# Patient Record
Sex: Male | Born: 1990 | Race: White | Hispanic: No | Marital: Single | Smoking: Current every day smoker
Health system: Southern US, Community
[De-identification: ages and names within clinical notes are randomized; demographics above are authoritative.]

---
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
## 2012-05-18 HISTORY — PX: WISDOM TOOTH EXTRACTION: SHX21

## 2013-07-18 ENCOUNTER — Encounter (HOSPITAL_COMMUNITY): Payer: Self-pay | Admitting: Emergency Medicine

## 2013-07-18 ENCOUNTER — Emergency Department (HOSPITAL_COMMUNITY)
Admission: EM | Admit: 2013-07-18 | Discharge: 2013-07-19 | Disposition: A | Discharge status: Home / Self Care | Payer: BC Managed Care – PPO | Attending: Emergency Medicine | Admitting: Emergency Medicine

## 2013-07-18 ENCOUNTER — Emergency Department (HOSPITAL_COMMUNITY): Payer: BC Managed Care – PPO

## 2013-07-18 DIAGNOSIS — Y929 Unspecified place or not applicable: Secondary | ICD-10-CM | POA: Insufficient documentation

## 2013-07-18 DIAGNOSIS — S41009A Unspecified open wound of unspecified shoulder, initial encounter: Secondary | ICD-10-CM | POA: Insufficient documentation

## 2013-07-18 DIAGNOSIS — F17200 Nicotine dependence, unspecified, uncomplicated: Secondary | ICD-10-CM | POA: Insufficient documentation

## 2013-07-18 DIAGNOSIS — G478 Other sleep disorders: Secondary | ICD-10-CM | POA: Insufficient documentation

## 2013-07-18 DIAGNOSIS — Z23 Encounter for immunization: Secondary | ICD-10-CM | POA: Insufficient documentation

## 2013-07-18 DIAGNOSIS — W1809XA Striking against other object with subsequent fall, initial encounter: Secondary | ICD-10-CM | POA: Insufficient documentation

## 2013-07-18 DIAGNOSIS — S0990XA Unspecified injury of head, initial encounter: Secondary | ICD-10-CM | POA: Insufficient documentation

## 2013-07-18 DIAGNOSIS — S41012A Laceration without foreign body of left shoulder, initial encounter: Secondary | ICD-10-CM

## 2013-07-18 DIAGNOSIS — F430 Acute stress reaction: Secondary | ICD-10-CM | POA: Insufficient documentation

## 2013-07-18 DIAGNOSIS — Z79899 Other long term (current) drug therapy: Secondary | ICD-10-CM | POA: Insufficient documentation

## 2013-07-18 DIAGNOSIS — R569 Unspecified convulsions: Secondary | ICD-10-CM | POA: Insufficient documentation

## 2013-07-18 DIAGNOSIS — Y9389 Activity, other specified: Secondary | ICD-10-CM | POA: Insufficient documentation

## 2013-07-18 LAB — CBG MONITORING, ED: Glucose-Capillary: 113 mg/dL — ABNORMAL HIGH (ref 70–99)

## 2013-07-18 MED ORDER — TETANUS-DIPHTH-ACELL PERTUSSIS 5-2.5-18.5 LF-MCG/0.5 IM SUSP
0.5000 mL | Freq: Once | INTRAMUSCULAR | Status: AC
  Administered 2013-07-19: 0.5 mL via INTRAMUSCULAR
  Filled 2013-07-18: qty 0.5

## 2013-07-18 NOTE — ED Provider Notes (Signed)
CSN: [id number]     Arrival date & time 07/18/13  2255 History   First MD Initiated Contact with Patient 07/18/13 2318     Chief Complaint  Patient presents with  . Fall  . Loss of Consciousness     (Consider location/radiation/quality/duration/timing/severity/associated sxs/prior Treatment) Patient is a 23 y.o. male presenting with seizures. The history is provided by the patient. No language interpreter was used.  Seizures Seizure activity on arrival: no   Seizure type:  Unable to specify Preceding symptoms comment:  None Initial focality:  None Episode characteristics: generalized shaking and unresponsiveness   Episode characteristics: no incontinence   Postictal symptoms: memory loss   Postictal symptoms: no somnolence   Postictal symptoms comment:  Headache Return to baseline: yes   Severity:  Moderate Duration:  1 minute Timing:  Once Number of seizures this episode:  1 Progression:  Resolved Context: decreased sleep, drug use, emotional upset and stress   Context: not alcohol withdrawal, not change in medication, not family hx of seizures, not fever, not possible medication ingestion and not previous head injury   Recent head injury:  No recent head injuries PTA treatment:  None History of seizures: no     History reviewed. No pertinent past medical history. Past Surgical History  Procedure Laterality Date  . Wisdom tooth extraction Bilateral 2014    x5   History reviewed. No pertinent family history. History  Substance Use Topics  . Smoking status: Current Every Day Smoker -- 0.25 packs/day    Types: Cigarettes  . Smokeless tobacco: Never Used  . Alcohol Use: Yes    Review of Systems  Constitutional: Negative for fever, activity change, appetite change and fatigue.  HENT: Negative for congestion, facial swelling, rhinorrhea and trouble swallowing.   Eyes: Negative for photophobia and pain.  Respiratory: Negative for cough, chest tightness and shortness of  breath.   Cardiovascular: Negative for chest pain and leg swelling.  Gastrointestinal: Negative for nausea, vomiting, abdominal pain, diarrhea and constipation.  Endocrine: Negative for polydipsia and polyuria.  Genitourinary: Negative for dysuria, urgency, decreased urine volume and difficulty urinating.  Musculoskeletal: Negative for back pain and gait problem.  Skin: Negative for color change, rash and wound.  Allergic/Immunologic: Negative for immunocompromised state.  Neurological: Positive for seizures. Negative for dizziness, facial asymmetry, speech difficulty, weakness, numbness and headaches.  Psychiatric/Behavioral: Positive for sleep disturbance. Negative for suicidal ideas, confusion, self-injury, decreased concentration and agitation.      Allergies  Review of patient's allergies indicates no known allergies.  Home Medications   Current Outpatient Rx  Name  Route  Sig  Dispense  Refill  . Cyanocobalamin (VITAMIN B-12 CR PO)   Oral   Take 1 tablet by mouth daily.         Marland Kitchen omega-3 acid ethyl esters (LOVAZA) 1 G capsule   Oral   Take 1 g by mouth daily.          BP 133/74  Pulse 78  Temp(Src) 98.8 F (37.1 C) (Oral)  Resp 18  SpO2 98% Physical Exam  Constitutional: He is oriented to person, place, and time. He appears well-developed and well-nourished. No distress.  HENT:  Head: Normocephalic and atraumatic.  Mouth/Throat: No oropharyngeal exudate.  Eyes: Pupils are equal, round, and reactive to light.  Neck: Normal range of motion. Neck supple.  Cardiovascular: Normal rate, regular rhythm and normal heart sounds.  Exam reveals no gallop and no friction rub.   No murmur heard. Pulmonary/Chest: Effort normal  and breath sounds normal. No respiratory distress. He has no wheezes. He has no rales.  Abdominal: Soft. Bowel sounds are normal. He exhibits no distension and no mass. There is no tenderness. There is no rebound and no guarding.  Musculoskeletal:  Normal range of motion. He exhibits no edema and no tenderness.       Arms: Neurological: He is alert and oriented to person, place, and time. He has normal strength. He displays no tremor. No cranial nerve deficit or sensory deficit. He exhibits normal muscle tone. He displays a negative Romberg sign. Coordination and gait normal. GCS eye subscore is 4. GCS verbal subscore is 5. GCS motor subscore is 6.  Skin: Skin is warm and dry.  Psychiatric: He has a normal mood and affect.    ED Course  Procedures (including critical care time) Labs Review Labs Reviewed  URINE RAPID DRUG SCREEN (HOSP PERFORMED) - Abnormal; Notable for the following:    Benzodiazepines POSITIVE (*)    Tetrahydrocannabinol POSITIVE (*)    All other components within normal limits  CBC - Abnormal; Notable for the following:    WBC 16.8 (*)    All other components within normal limits  URINALYSIS, ROUTINE W REFLEX MICROSCOPIC - Abnormal; Notable for the following:    Hgb urine dipstick TRACE (*)    Protein, ur 30 (*)    All other components within normal limits  CBG MONITORING, ED - Abnormal; Notable for the following:    Glucose-Capillary 113 (*)    All other components within normal limits  COMPREHENSIVE METABOLIC PANEL  URINE MICROSCOPIC-ADD ON   Imaging Review Ct Head Wo Contrast  07/19/2013   CLINICAL DATA:  New sided onset of seizures. Loss of consciousness. Not sure if hit head.  EXAM: CT HEAD WITHOUT CONTRAST  TECHNIQUE: Contiguous axial images were obtained from the base of the skull through the vertex without intravenous contrast.  COMPARISON:  None.  FINDINGS: No skull fracture or intracranial hemorrhage.  No intracranial mass lesion noted on this unenhanced exam.  No CT evidence of large acute infarct.  No hydrocephalus  For further delineation of seizure focus, contrast enhanced MR on elective basis is recommended.  IMPRESSION: No skull fracture or intracranial hemorrhage.  No seizure focus identified on  the present unenhanced head CT. Follow-up as noted above.   Electronically Signed   By: Bridgett Larsson M.D.   On: 07/19/2013 00:40     EKG Interpretation   Date/Time:  Tuesday July 18 2013 23:21:57 EST Ventricular Rate:  77 PR Interval:  122 QRS Duration: 87 QT Interval:  350 QTC Calculation: 396 R Axis:   76 Text Interpretation:  Sinus rhythm Anterolateral Q wave, probably normal  for age No old tracing to compare Confirmed by Mendel Binsfeld  MD, Caridad Silveira [phone])  on 07/18/2013 11:53:10 PM      MDM   Final diagnoses:  Seizure  Laceration of shoulder, left    Pt is a 23 y.o. male with Pmhx as above who presents with new onset possible seizure at home just PTA. Pt states he has been stressed recently over the ill health of his mother, has not slept in 2 days. Per roommates, he got up from the couch to answer the door, grabbed his face, fell backwards and shattered a pyrex dish on a table top, and about 1 min of generalized shaking, then woke up with h/a, but no post-itcal phase.  Pt denies current SI/HI, but has been depressed due to ill health  of his mother.  Denies fever, chills, n/v, d/a, focal neuro complaints other than h/a. On PE, VSS, pt in NAD. No focal neuro exam findings. He has several lacerations to L shoulder. EKG w/o ischemic changes, CT head unremarkable, CBC, BMP grossly nml.  UDS positive for benzos and THC.  It is not clear if this is new onset seizure vs syncope w/ myoclonic jerking.  As pt at baseline, will d/c home with plan for outpt f/u with Neurology. Resources also given for outpt pysch referral.         Shanna CiscoMegan E Damauri Minion, MD 07/19/13 [phone]

## 2013-07-18 NOTE — ED Notes (Addendum)
Pt reports that he got off the couch and fell, losing consciousness and was told he was seizing on the floor, no prior hx of any related events, does not remember these events. Pt hit a glass table and has multiple 2in lacs to his L shoulder and side. Pt a&o x4, reports he is under a lot of stress, no other changes known.

## 2013-07-19 LAB — RAPID URINE DRUG SCREEN, HOSP PERFORMED
Amphetamines: NOT DETECTED
Barbiturates: NOT DETECTED
Benzodiazepines: POSITIVE — AB
COCAINE: NOT DETECTED
OPIATES: NOT DETECTED
TETRAHYDROCANNABINOL: POSITIVE — AB

## 2013-07-19 LAB — COMPREHENSIVE METABOLIC PANEL
ALT: 13 U/L (ref 0–53)
AST: 22 U/L (ref 0–37)
Albumin: 4.6 g/dL (ref 3.5–5.2)
Alkaline Phosphatase: 71 U/L (ref 39–117)
BUN: 7 mg/dL (ref 6–23)
CO2: 23 mEq/L (ref 19–32)
CREATININE: 0.85 mg/dL (ref 0.50–1.35)
Calcium: 9.6 mg/dL (ref 8.4–10.5)
Chloride: 102 mEq/L (ref 96–112)
GFR calc Af Amer: >90 mL/min (ref >90)
GFR calc non Af Amer: >90 mL/min (ref >90)
Glucose, Bld: 92 mg/dL (ref 70–99)
Potassium: 3.8 mEq/L (ref 3.7–5.3)
SODIUM: 140 meq/L (ref 137–147)
TOTAL PROTEIN: 7.4 g/dL (ref 6.0–8.3)
Total Bilirubin: 1 mg/dL (ref 0.3–1.2)

## 2013-07-19 LAB — CBC
HCT: 43.6 % (ref 39.0–52.0)
HEMOGLOBIN: 15.7 g/dL (ref 13.0–17.0)
MCH: 32.2 pg (ref 26.0–34.0)
MCHC: 36 g/dL (ref 30.0–36.0)
MCV: 89.5 fL (ref 78.0–100.0)
Platelets: 254 10*3/uL (ref 150–400)
RBC: 4.87 MIL/uL (ref 4.22–5.81)
RDW: 12.9 % (ref 11.5–15.5)
WBC: 16.8 10*3/uL — ABNORMAL HIGH (ref 4.0–10.5)

## 2013-07-19 LAB — URINALYSIS, ROUTINE W REFLEX MICROSCOPIC
Bilirubin Urine: NEGATIVE
Glucose, UA: NEGATIVE mg/dL
Ketones, ur: NEGATIVE mg/dL
LEUKOCYTES UA: NEGATIVE
NITRITE: NEGATIVE
PH: 5.5 (ref 5.0–8.0)
Protein, ur: 30 mg/dL — AB
Specific Gravity, Urine: 1.017 (ref 1.005–1.030)
UROBILINOGEN UA: 0.2 mg/dL (ref 0.0–1.0)

## 2013-07-19 LAB — URINE MICROSCOPIC-ADD ON

## 2013-07-19 MED ORDER — IBUPROFEN 200 MG PO TABS
600.0000 mg | ORAL_TABLET | Freq: Once | ORAL | Status: AC
  Administered 2013-07-19: 600 mg via ORAL
  Filled 2013-07-19: qty 3

## 2013-07-19 NOTE — ED Notes (Signed)
MD at bedside at this time.

## 2013-07-19 NOTE — Discharge Instructions (Signed)
Laceration Care, Adult °A laceration is a cut or lesion that goes through all layers of the skin and into the tissue just beneath the skin. °TREATMENT  °Some lacerations may not require closure. Some lacerations may not be able to be closed due to an increased risk of infection. It is important to see your caregiver as soon as possible after an injury to minimize the risk of infection and maximize the opportunity for successful closure. °If closure is appropriate, pain medicines may be given, if needed. The wound will be cleaned to help prevent infection. Your caregiver will use stitches (sutures), staples, wound glue (adhesive), or skin adhesive strips to repair the laceration. These tools bring the skin edges together to allow for faster healing and a better cosmetic outcome. However, all wounds will heal with a scar. Once the wound has healed, scarring can be minimized by covering the wound with sunscreen during the day for 1 full year. °HOME CARE INSTRUCTIONS  °For sutures or staples: °· Keep the wound clean and dry. °· If you were given a bandage (dressing), you should change it at least once a day. Also, change the dressing if it becomes wet or dirty, or as directed by your caregiver. °· Wash the wound with soap and water 2 times a day. Rinse the wound off with water to remove all soap. Pat the wound dry with a clean towel. °· After cleaning, apply a thin layer of the antibiotic ointment as recommended by your caregiver. This will help prevent infection and keep the dressing from sticking. °· You may shower as usual after the first 24 hours. Do not soak the wound in water until the sutures are removed. °· Only take over-the-counter or prescription medicines for pain, discomfort, or fever as directed by your caregiver. °· Get your sutures or staples removed as directed by your caregiver. °For skin adhesive strips: °· Keep the wound clean and dry. °· Do not get the skin adhesive strips wet. You may bathe  carefully, using caution to keep the wound dry. °· If the wound gets wet, pat it dry with a clean towel. °· Skin adhesive strips will fall off on their own. You may trim the strips as the wound heals. Do not remove skin adhesive strips that are still stuck to the wound. They will fall off in time. °For wound adhesive: °· You may briefly wet your wound in the shower or bath. Do not soak or scrub the wound. Do not swim. Avoid periods of heavy perspiration until the skin adhesive has fallen off on its own. After showering or bathing, gently pat the wound dry with a clean towel. °· Do not apply liquid medicine, cream medicine, or ointment medicine to your wound while the skin adhesive is in place. This may loosen the film before your wound is healed. °· If a dressing is placed over the wound, be careful not to apply tape directly over the skin adhesive. This may cause the adhesive to be pulled off before the wound is healed. °· Avoid prolonged exposure to sunlight or tanning lamps while the skin adhesive is in place. Exposure to ultraviolet light in the first year will darken the scar. °· The skin adhesive will usually remain in place for 5 to 10 days, then naturally fall off the skin. Do not pick at the adhesive film. °You may need a tetanus shot if: °· You cannot remember when you had your last tetanus shot. °· You have never had a tetanus   shot. If you get a tetanus shot, your arm may swell, get red, and feel warm to the touch. This is common and not a problem. If you need a tetanus shot and you choose not to have one, there is a rare chance of getting tetanus. Sickness from tetanus can be serious. SEEK MEDICAL CARE IF:   You have redness, swelling, or increasing pain in the wound.  You see a red line that goes away from the wound.  You have yellowish-white fluid (pus) coming from the wound.  You have a fever.  You notice a bad smell coming from the wound or dressing.  Your wound breaks open before or  after sutures have been removed.  You notice something coming out of the wound such as wood or glass.  Your wound is on your hand or foot and you cannot move a finger or toe. SEEK IMMEDIATE MEDICAL CARE IF:   Your pain is not controlled with prescribed medicine.  You have severe swelling around the wound causing pain and numbness or a change in color in your arm, hand, leg, or foot.  Your wound splits open and starts bleeding.  You have worsening numbness, weakness, or loss of function of any joint around or beyond the wound.  You develop painful lumps near the wound or on the skin anywhere on your body. MAKE SURE YOU:   Understand these instructions.  Will watch your condition.  Will get help right away if you are not doing well or get worse. Document Released: 05/04/2005 Document Revised: 07/27/2011 Document Reviewed: 10/28/2010 [facility] Patient Information 2014 [place], [place]. Seizure, Adult A seizure is abnormal electrical activity in the brain. Seizures usually last from 30 seconds to 2 minutes. There are various types of seizures. Before a seizure, you may have a warning sensation (aura) that a seizure is about to occur. An aura may include the following symptoms:   Fear or anxiety.  Nausea.  Feeling like the room is spinning (vertigo).  Vision changes, such as seeing flashing lights or spots. Common symptoms during a seizure include:  A change in attention or behavior (altered mental status).  Convulsions with rhythmic jerking movements.  Drooling.  Rapid eye movements.  Grunting.  Loss of bladder and bowel control.  Bitter taste in the mouth.  Tongue biting. After a seizure, you may feel confused and sleepy. You may also have an injury resulting from convulsions during the seizure. HOME CARE INSTRUCTIONS   If you are given medicines, take them exactly as prescribed by your health care provider.  Keep all follow-up appointments as directed by your  health care provider.  Do not swim or drive or engage in risky activity during which a seizure could cause further injury to you or others until your health care provider says it is OK.  Get adequate rest.  Teach friends and family what to do if you have a seizure. They should:  Lay you on the ground to prevent a fall.  Put a cushion under your head.  Loosen any tight clothing around your neck.  Turn you on your side. If vomiting occurs, this helps keep your airway clear.  Stay with you until you recover.  Know whether or not you need emergency care. SEEK IMMEDIATE MEDICAL CARE IF:  The seizure lasts longer than 5 minutes.  The seizure is severe or you do not wake up immediately after the seizure.  You have an altered mental status after the seizure.  You are having  more frequent or worsening seizures. Someone should drive you to the emergency department or call local emergency services (911 in [place] MAKE SURE YOU:  Understand these instructions.  Will watch your condition.  Will get help right away if you are not doing well or get worse. Document Released: 05/01/2000 Document Revised: 02/22/2013 Document Reviewed: 12/14/2012 [facility] Patient Information 2014 [place], [place].   Emergency Department Resource Guide 1) Find a Doctor and Pay Out of Pocket Although you won't have to find out who is covered by your insurance plan, it is a good idea to ask around and get recommendations. You will then need to call the office and see if the doctor you have chosen will accept you as a new patient and what types of options they offer for patients who are self-pay. Some doctors offer discounts or will set up payment plans for their patients who do not have insurance, but you will need to ask so you aren't surprised when you get to your appointment.  2) Contact Your Local Health Department Not all health departments have doctors that can see patients for sick visits, but many do, so  it is worth a call to see if yours does. If you don't know where your local health department is, you can check in your phone book. The CDC also has a tool to help you locate your state's health department, and many state websites also have listings of all of their local health departments.  3) Find a Walk-in Clinic If your illness is not likely to be very severe or complicated, you may want to try a walk in clinic. These are popping up all over the country in pharmacies, drugstores, and shopping centers. They're usually staffed by nurse practitioners or physician assistants that have been trained to treat common illnesses and complaints. They're usually fairly quick and inexpensive. However, if you have serious medical issues or chronic medical problems, these are probably not your best option.  No Primary Care Doctor: - Call Health Connect at  [phone] - they can help you locate a primary care doctor that  accepts your insurance, provides certain services, etc. - Physician Referral Service- [phone]  Chronic Pain Problems: Organization         Address  Phone   Notes  [facility]  [phone] Patients need to be referred by their primary care doctor.   Medication Assistance: Organization         Address  Phone   Notes  [facility] Medication [facility] [address] [place] [place], [place] [address] [phone] --Must be a resident of [facility] -- Must have NO insurance coverage whatsoever (no Medicaid/ Medicare, etc.) -- The pt. MUST have a primary care doctor that directs their care regularly and follows them in the community   [facility]  [phone]   [employer]  [phone]    Agencies that provide inexpensive medical care: Organization         Address  Phone   Notes  [facility]  [phone]   [facility]    [phone]   [facility] [address] [place], [place] [address] [phone]   [facility] [address] [place] [place] [phone]   [facility]anned Parenthood    [phone]   [facility]    [phone]   [facility]  [address] [place],  [place] Phone:  [phone], Fax:  [phone] Hours of Operation:  9 am - 6 pm, M-F.  Also accepts Medicaid/Medicare and self-pay.  [facility]  [address], [place] Phone: [phone], Fax: [phone]. Hours of Operation:  8:30 am - 5:30 pm, M-F.  Also accepts Medicaid and self-pay.  [facility] [place] [address] [place] Phone: [phone]   [facility] [address] Natasha Bence [place], [place] [phone], Ext. [phone] Mondays & Thursdays: 7-9 AM.  First 15 patients are seen on a first come, first serve basis.    Medicaid-accepting [facility] Providers:  Organization         Address  Phone   Notes   [facility] [address], [place] [phone] Also accepts self-pay patients.  [facility] [address] Laurell Josephs [place], [place]  [phone]   [facility] [address], [place] [phone]   [facility] [address] [place] [phone]   Renaye Rakers [address], [place]   [phone] Only accepts [place] Access [place] patients after they have their name applied to their card.   Self-Pay (no insurance) in [facility]:  Organization         Address  Phone   Notes  Sickle Cell Patients, [facility] [address] [place], [place] [phone]   [facility] [address] [place], [place] [phone]   [facility] [place]  [address], [place] [phone]   [facility]/Dr. Osei-Bonsu  [address], [place] or  [address], [place] [phone] Phone number for both [place] and [place] locations is the same.  [facility] [address] [place] [phone]   [facility] [address], [place] or [address] [phone]   [facility] [address], [place] [phone], phone; [phone], fax Sees patients 1st and 3rd Saturday of every month.  Must not qualify for public or private insurance (i.e. Medicaid, Medicare, [place] Health Choice, Veterans' Benefits)  Household income should be no more than 200% of the poverty level The clinic cannot treat you if you are pregnant or think you are pregnant  Sexually transmitted diseases are not treated at the clinic.    Dental Care: Organization         Address  Phone  Notes  [facility] [address] [place], [place] [phone] Accepts children up to age 57 who are enrolled in [place] or [place] Health Choice; pregnant women with a Medicaid card; and children who have applied for Medicaid or [place] Health Choice, but were declined, whose parents can pay a reduced fee at time of service.  [facility]  [address], [place] [phone] Accepts children up to age 42 who are enrolled in [place] or [place] Health Choice; pregnant women with a Medicaid card; and children who have applied for Medicaid or [place] Health Choice, but were declined, whose parents can pay a reduced fee at time of service.  [facility]  [address] [place], [place] [phone] Patients are seen by appointment  only. Walk-ins are not accepted. [facility] will see patients 8 years of age and older. Monday - Tuesday (8am-5pm) Most Wednesdays (8:30-5pm) $30 per visit, cash only  [facility] Adult [facility]  [address], [facility] [phone] Patients are seen by appointment only. Walk-ins are not accepted. [facility] will see patients 4 years of age and older. One Wednesday Evening (Monthly: Volunteer Based).  $30 per visit, cash only  [employer]  [phone] for adults; Children under age 61, call Graduate Pediatric Dentistry at [phone]. Children aged 3-14, please call [phone] to request a pediatric application.  Dental services are provided in all areas of dental care including fillings, crowns and bridges, complete and partial dentures, implants, gum treatment, root canals, and extractions. Preventive care is also provided. Treatment is provided to both adults and children. Patients are selected via a lottery and there is often a waiting list.   [facility] [address] [place]  [phone] [email or web address]   [facility] [address] [place], [place] [phone], Ext. [phone] Second and Fourth Thursday of each month, opens at 6:30 AM; Clinic ends at 9 AM.  Patients are seen on a first-come first-served basis, and a limited number are seen during each clinic.   [facility]  [address] Ether Griffins [place], [place] [phone]   Eligibility Requirements You must have lived in [place], [place], or [place] counties for at least the last three months.   You cannot be eligible for state or federal sponsored [place], including [employer], [place], or [employer].   You generally cannot be eligible for healthcare insurance through your employer.    How to apply: Eligibility screenings are held every Tuesday and Wednesday afternoon from 1:00 pm until 4:00 pm. You do not need an appointment for the interview!  [facility] [address] [place], [place] [phone]   [facility] Health Department  [phone]   [facility] Health Department  [phone]   [facility]  Health Department  [phone]    Behavioral Health Resources in the Community: Intensive Outpatient Programs Organization         Address  Phone  Notes  [facility] [address], [place], [place] [phone]   [facility] Outpatient [address] [place], [place] [phone]   [facility] [address], [place], [place]  [phone]   [facility] [address],  [place], [place] [phone] or [phone]   Substance Abuse Resources Organization         Address  Phone  Notes  [facility]  [phone]   [facility]  [phone]   [facility] [place]  [phone]   Floydene Flock  [phone]   Residential & Outpatient Substance Abuse Program  [phone]   Psychological Services Organization         Address  Phone  Notes  [facility] Behavioral Health  [phone]   [facility]  [phone]   [facility] Mental Health [address] [place] [phone] or [phone]    Mobile Crisis Teams Organization         Address  Phone  Notes  [facility], Mobile Crisis Care Unit  [phone]   [facility]  [address] [place], [place] [phone]   [facility] [address] [place] [phone]  Self-Help/Support Groups Organization         Address  Phone             Notes  [facility] [place] - variety of support groups  336- I7437963 Call for more information  Narcotics Anonymous (NA), [facility] [address], [employer] [place]  2 meetings at this location   Statistician         Address  Phone  Notes  [facility] [address],    [place]  [phone]   [facility]  [address], [place] [address], [place], [place] [phone]   [facility] [address] [place], [place] [phone]  Admissions: 8am-3pm M-F  [facility] [address]    [place], [place] [phone]   [facility] [address] [place], [place], [place] [phone]   [facility] [address]  [place], [place] [phone]   [facility] - Intensive Outpatient [address] Laurell Josephs [address], [place], [place] [phone]   [facility] ([facility] [address] [place]  [place], [place] [phone] or [phone]   [facility] ([facility]) [address], [place], [place] [phone] Accepts Medicaid  [facility] [address]  [place] [phone] Substance Abuse/Addiction Treatment   [facility] Organization         Address  Phone  Notes  [facility]  [phone]   Angie Fava, PhD [address] Ervin Knack [place], [place]   [phone] or [phone]   [facility] Behavioral   [address] [place], [place] [phone]   [facility] [address] [place], [place] [phone] Insurance/Medicaid/sponsorship through [facility] and Families [address]                                    [place], [place] [phone] Therapy/tele-psych/case  [facility] [address], [place] [phone]    Dr. Lolly Mustache  [phone]   [facility]  [employer] [facility] Dept. 1) [address] [place] 2) [address] [place] 3)  [address], [place] [phone]  [phone]   [facility] [place] Child Abuse Hotline 580-186-9021 or [phone] (After Hours)

## 2013-07-19 NOTE — ED Notes (Signed)
PA at bedside with suture cart 

## 2014-11-25 IMAGING — CT CT HEAD W/O CM
2 series · 17 of 30 positions shown, 20 images · non-contrast
Comparison: None.

CLINICAL DATA: New sided onset of seizures. Loss of consciousness.
Not sure if hit head.

EXAM:
CT HEAD WITHOUT CONTRAST
TECHNIQUE: Contiguous axial images were obtained from the base of the skull
through the vertex without intravenous contrast.

[Series 2: head w/o · axial · non-contrast · 0.48mm/px · z∈[+1476,+1596]mm · 9 of 31 slices shown, 12 images]
[im 4/31  brain]
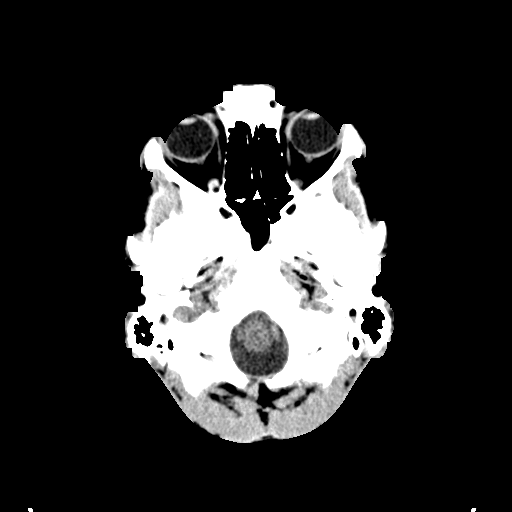
[im 4/31  bone]
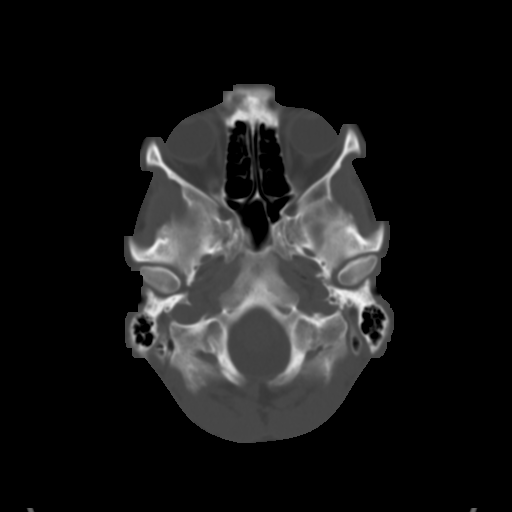
[im 7/31  brain]
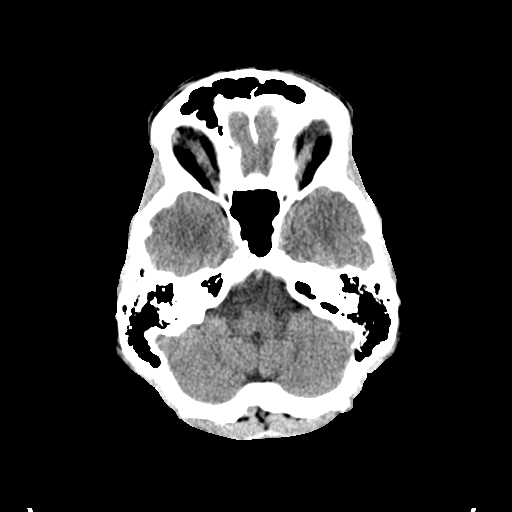
[im 10/31  brain]
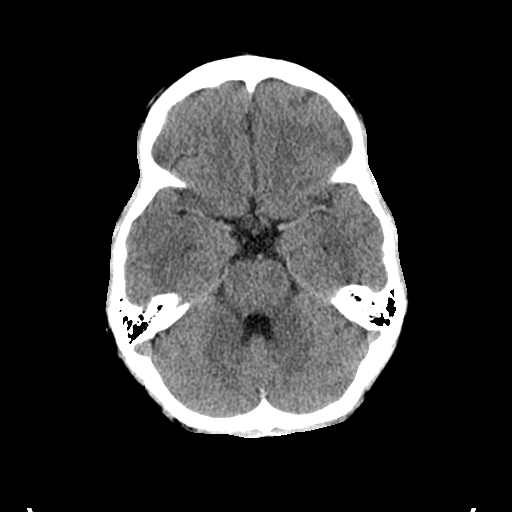
[im 13/31  brain]
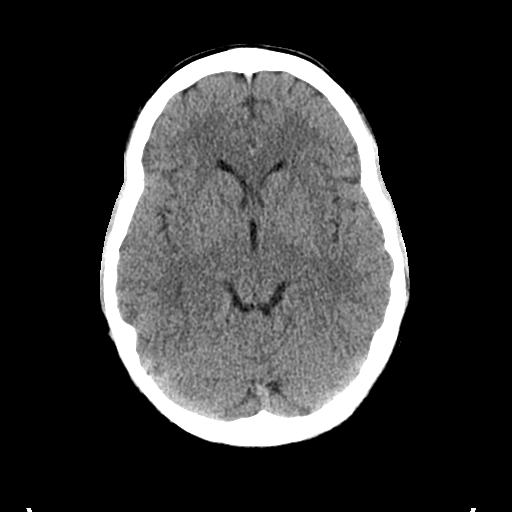
[im 16/31  brain]
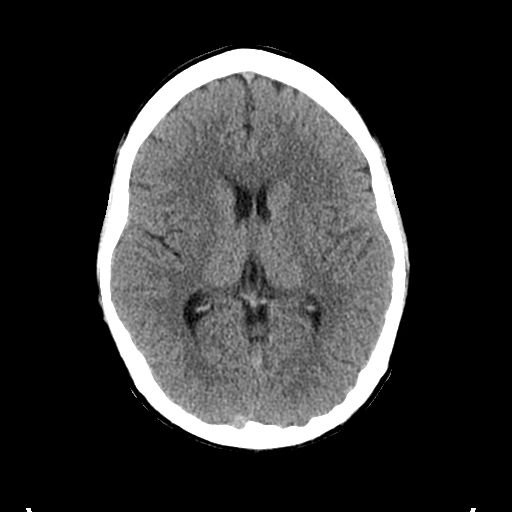
[im 16/31  bone]
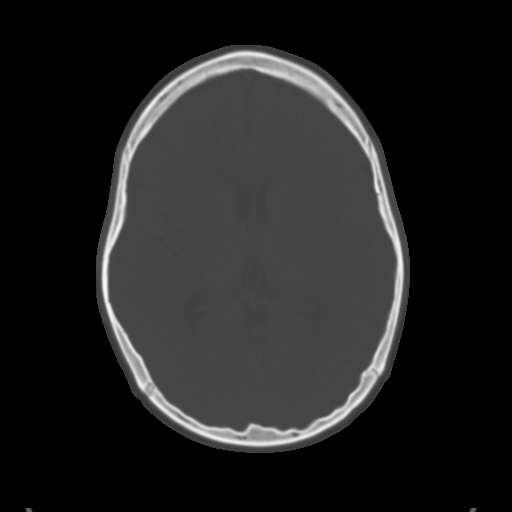
[im 19/31  brain]
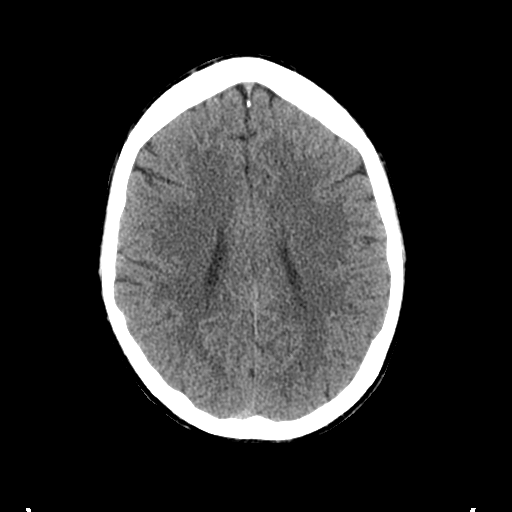
[im 22/31  brain]
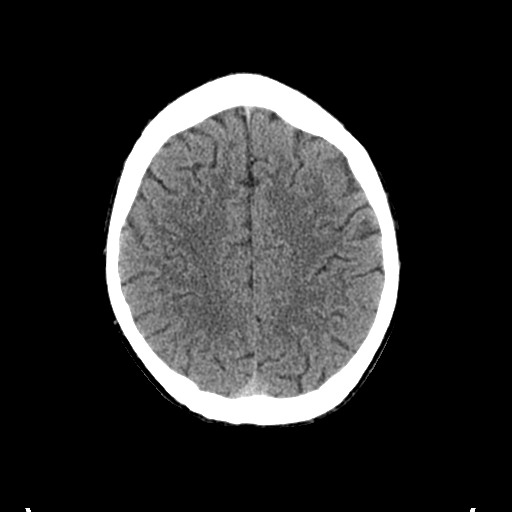
[im 25/31  brain]
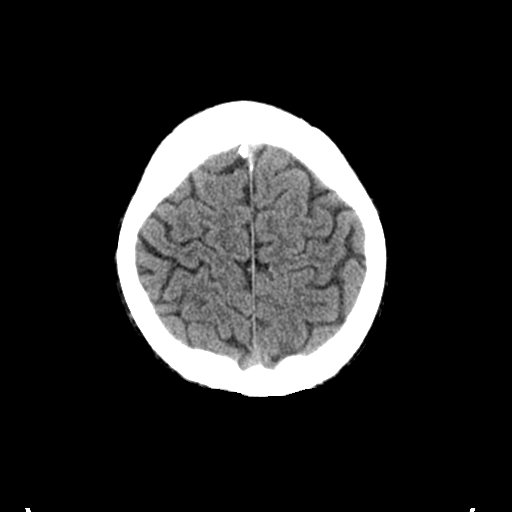
[im 28/31  brain]
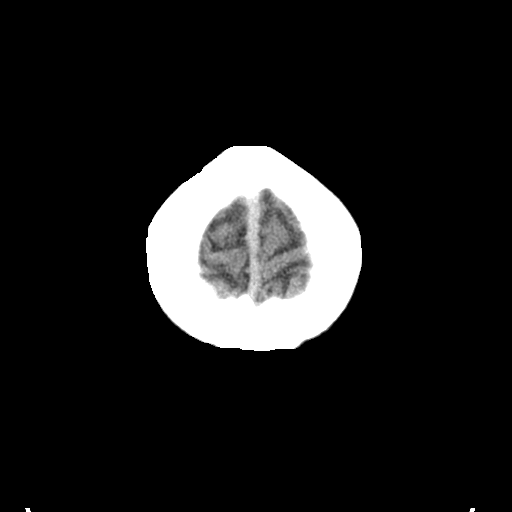
[im 28/31  bone]
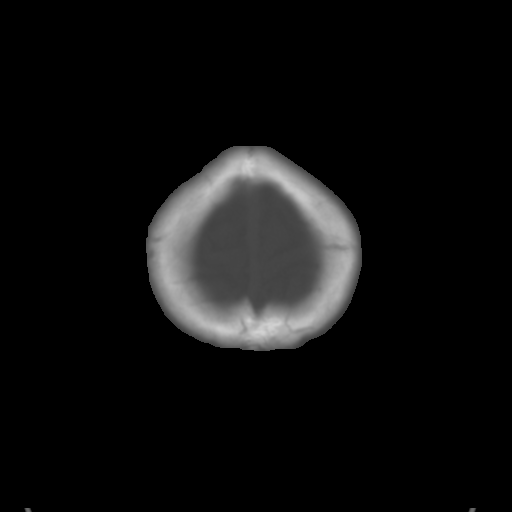

[Series 3: bone windows · axial · 0.48mm/px · z∈[+1476,+1593]mm · 8 of 51 slices shown]
[im 6/51  bone]
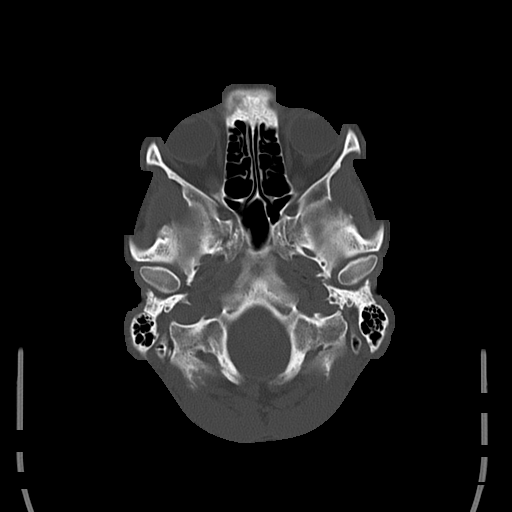
[im 12/51  bone]
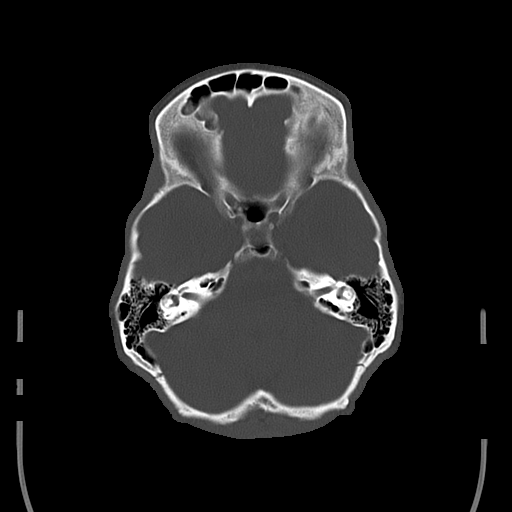
[im 17/51  bone]
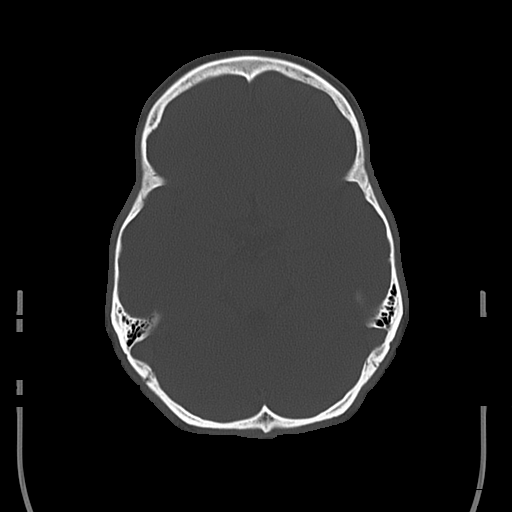
[im 23/51  bone]
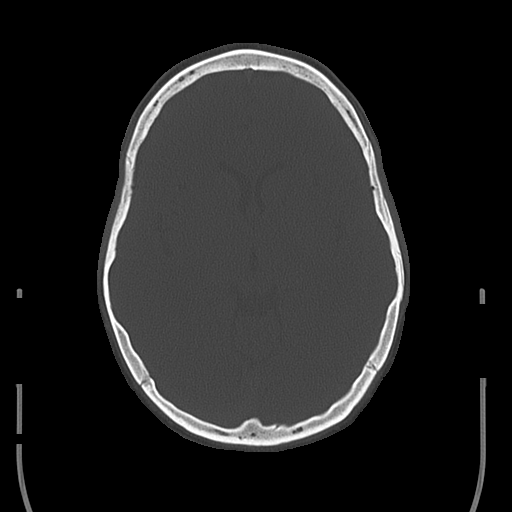
[im 28/51  bone]
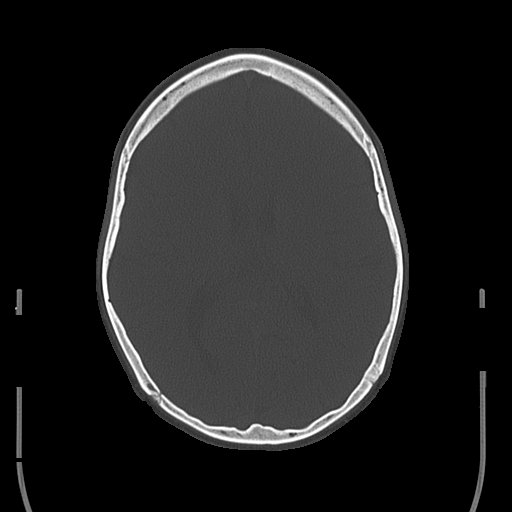
[im 34/51  bone]
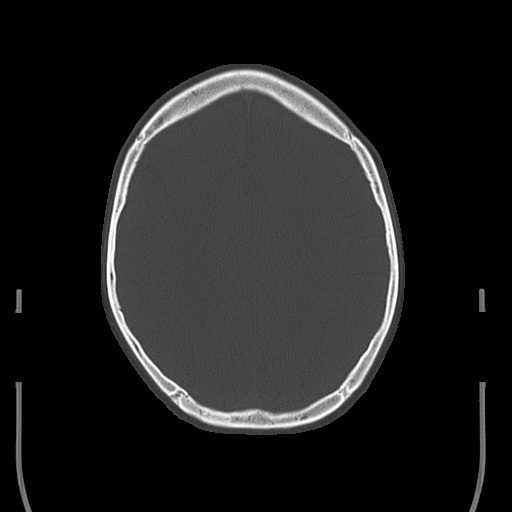
[im 39/51  bone]
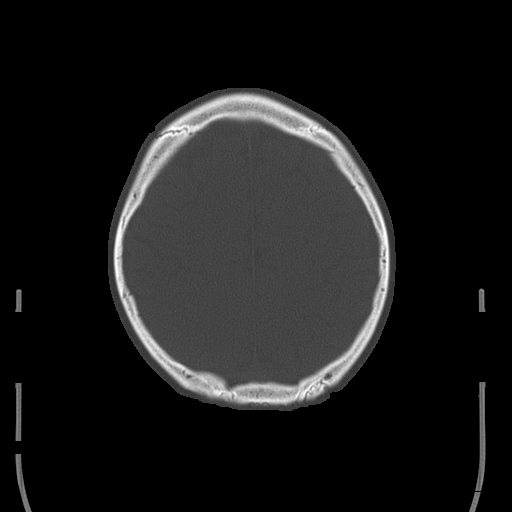
[im 45/51  bone]
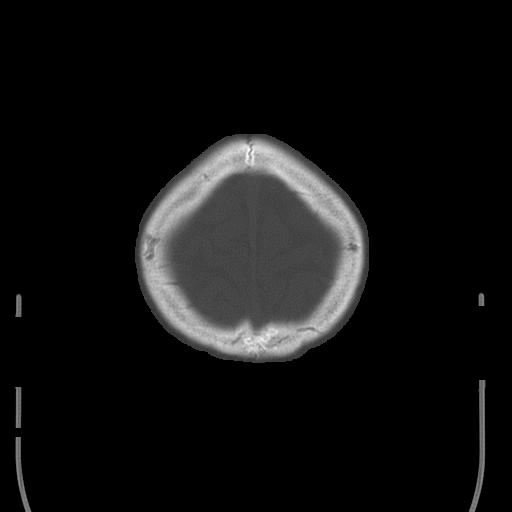

[17 of 30 positions shown; findings below may reference images not displayed]

FINDINGS: No skull fracture or intracranial hemorrhage.

No intracranial mass lesion noted on this unenhanced exam.

No CT evidence of large acute infarct.

No hydrocephalus

For further delineation of seizure focus, contrast enhanced MR on
elective basis is recommended.
IMPRESSION: No skull fracture or intracranial hemorrhage.

No seizure focus identified on the present unenhanced head CT.
Follow-up as noted above.
